# Patient Record
Sex: Female | Born: 2000 | Race: White | Hispanic: No | Marital: Single | Smoking: Never smoker
Health system: Southern US, Community
[De-identification: ages and names within clinical notes are randomized; demographics above are authoritative.]

## PROBLEM LIST (undated history)

## (undated) DIAGNOSIS — Z789 Other specified health status: Secondary | ICD-10-CM

## (undated) DIAGNOSIS — F840 Autistic disorder: Secondary | ICD-10-CM

## (undated) DIAGNOSIS — Q638 Other specified congenital malformations of kidney: Secondary | ICD-10-CM

## (undated) DIAGNOSIS — F32A Depression, unspecified: Secondary | ICD-10-CM

## (undated) DIAGNOSIS — F329 Major depressive disorder, single episode, unspecified: Secondary | ICD-10-CM

## (undated) DIAGNOSIS — F419 Anxiety disorder, unspecified: Secondary | ICD-10-CM

## (undated) HISTORY — DX: Depression, unspecified: F32.A

## (undated) HISTORY — DX: Other specified congenital malformations of kidney: Q63.8

## (undated) HISTORY — DX: Major depressive disorder, single episode, unspecified: F32.9

## (undated) HISTORY — PX: OTHER SURGICAL HISTORY: SHX169

## (undated) HISTORY — DX: Autistic disorder: F84.0

## (undated) HISTORY — DX: Anxiety disorder, unspecified: F41.9

---
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
## 2020-01-06 ENCOUNTER — Encounter: Payer: Self-pay | Admitting: *Deleted

## 2020-01-06 ENCOUNTER — Emergency Department
Admission: EM | Admit: 2020-01-06 | Discharge: 2020-01-07 | Disposition: A | Discharge status: Other Acute Inpatient Hospital | Payer: Medicaid Other | Attending: Emergency Medicine | Admitting: Emergency Medicine

## 2020-01-06 ENCOUNTER — Other Ambulatory Visit: Payer: Self-pay

## 2020-01-06 DIAGNOSIS — F332 Major depressive disorder, recurrent severe without psychotic features: Secondary | ICD-10-CM | POA: Diagnosis not present

## 2020-01-06 DIAGNOSIS — Z20822 Contact with and (suspected) exposure to covid-19: Secondary | ICD-10-CM | POA: Insufficient documentation

## 2020-01-06 DIAGNOSIS — R45851 Suicidal ideations: Secondary | ICD-10-CM | POA: Diagnosis not present

## 2020-01-06 DIAGNOSIS — F840 Autistic disorder: Secondary | ICD-10-CM | POA: Insufficient documentation

## 2020-01-06 LAB — COMPREHENSIVE METABOLIC PANEL
ALT: 10 U/L (ref 0–44)
AST: 20 U/L (ref 15–41)
Albumin: 4.9 g/dL (ref 3.5–5.0)
Alkaline Phosphatase: 77 U/L (ref 38–126)
Anion gap: 11 (ref 5–15)
BUN: 7 mg/dL (ref 6–20)
CO2: 28 mmol/L (ref 22–32)
Calcium: 9.4 mg/dL (ref 8.9–10.3)
Chloride: 104 mmol/L (ref 98–111)
Creatinine, Ser: 0.79 mg/dL (ref 0.44–1.00)
GFR, Estimated: >60 mL/min (ref >60)
Glucose, Bld: 97 mg/dL (ref 70–99)
Potassium: 3.5 mmol/L (ref 3.5–5.1)
Sodium: 143 mmol/L (ref 135–145)
Total Bilirubin: 0.9 mg/dL (ref 0.3–1.2)
Total Protein: 7.9 g/dL (ref 6.5–8.1)

## 2020-01-06 LAB — CBC
HCT: 42 % (ref 36.0–46.0)
Hemoglobin: 14.6 g/dL (ref 12.0–15.0)
MCH: 32 pg (ref 26.0–34.0)
MCHC: 34.8 g/dL (ref 30.0–36.0)
MCV: 92.1 fL (ref 80.0–100.0)
Platelets: 226 10*3/uL (ref 150–400)
RBC: 4.56 MIL/uL (ref 3.87–5.11)
RDW: 11.7 % (ref 11.5–15.5)
WBC: 5.8 10*3/uL (ref 4.0–10.5)
nRBC: 0 % (ref 0.0–0.2)

## 2020-01-06 LAB — URINE DRUG SCREEN, QUALITATIVE (ARMC ONLY)
Amphetamines, Ur Screen: NOT DETECTED
Barbiturates, Ur Screen: NOT DETECTED
Benzodiazepine, Ur Scrn: NOT DETECTED
Cannabinoid 50 Ng, Ur ~~LOC~~: NOT DETECTED
Cocaine Metabolite,Ur ~~LOC~~: NOT DETECTED
MDMA (Ecstasy)Ur Screen: NOT DETECTED
Methadone Scn, Ur: NOT DETECTED
Opiate, Ur Screen: NOT DETECTED
Phencyclidine (PCP) Ur S: NOT DETECTED
Tricyclic, Ur Screen: NOT DETECTED

## 2020-01-06 LAB — RESPIRATORY PANEL BY RT PCR (FLU A&B, COVID)
Influenza A by PCR: NEGATIVE
Influenza B by PCR: NEGATIVE
SARS Coronavirus 2 by RT PCR: NEGATIVE

## 2020-01-06 LAB — ACETAMINOPHEN LEVEL: Acetaminophen (Tylenol), Serum: <10 ug/mL — ABNORMAL LOW (ref 10–30)

## 2020-01-06 LAB — POCT PREGNANCY, URINE: Preg Test, Ur: NEGATIVE

## 2020-01-06 LAB — ETHANOL: Alcohol, Ethyl (B): <10 mg/dL (ref <10)

## 2020-01-06 LAB — SALICYLATE LEVEL: Salicylate Lvl: <7 mg/dL — ABNORMAL LOW (ref 7.0–30.0)

## 2020-01-06 NOTE — ED Notes (Signed)
Hourly rounding completed at this time, patient currently awake in hallway bed. No complaints, stable, and in no acute distress. Q15 minute rounds and monitoring via Rover and Officer to continue. 

## 2020-01-06 NOTE — ED Notes (Addendum)
Pts belongings include -  Phone  Wallet Keys Necklace Ear rings Red and black Sweatshirt Jeans Red socks Flip flops Black Shirt Brown belt Loose change in pants pocket Black underwear.
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
## 2020-01-06 NOTE — ED Notes (Signed)
Patient transferred from Triage to room [facility] after dressing out and screening for contraband. Report received from [place], [place] including situation, background, assessment and recommendations. Pt oriented to [employer] including Q15 minute rounds as well as Psychologist, counselling for their protection. Patient is alert and oriented, warm and dry in no acute distress. Patient denies HI, and AVH. Pt. Encouraged to let this nurse know if needs arise.

## 2020-01-06 NOTE — ED Notes (Signed)
Pt given graham crackers and apple juice  

## 2020-01-06 NOTE — ED Notes (Addendum)
Pts phone numbers  [facility] - [phone] Janean Sark (sister) - [phone] [address] [phone] Renea Ee [phone] Kylie - [phone]

## 2020-01-06 NOTE — BH Assessment (Signed)
PATIENT BED AVAILABLE AFTER 12:30AM  Patient is to be admitted to [facility] by Psychiatric Nurse Practitioner Nira Conn.  Attending Physician will be Dr. Les Pou.   Patient has been assigned to room 303, by [facility] Charge Nurse [place].    ER staff is aware of the admission:  [facility] ER Secretary    Dr. Scotty Court, ER MD   Thayer Ohm Patient's Nurse   [facility] Patient Access

## 2020-01-06 NOTE — ED Triage Notes (Signed)
Pt to ED reporting SI for the past two months. Pt has a hx of the same with an attempt years ago. No self harm or suicide attempts but pt reports the the thoughts have been getting worse due to "emotional pressure."   Pt has anxiety and depression medications at home that had been helping but pt reports having stopped taking them in September without any real reason. Pt reports she has them at home currently.   No HI or hallucinations. Pt denies drug or alcohol use.
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
## 2020-01-06 NOTE — ED Notes (Signed)
Pt. POC was Negative.  

## 2020-01-07 ENCOUNTER — Encounter: Payer: Self-pay | Admitting: Nurse Practitioner

## 2020-01-07 ENCOUNTER — Inpatient Hospital Stay
Admission: AD | Admit: 2020-01-07 | Discharge: 2020-01-09 | DRG: 885 | Disposition: A | Discharge status: Home / Self Care | Payer: Medicaid Other | Source: Intra-hospital | Attending: Behavioral Health | Admitting: Behavioral Health

## 2020-01-07 DIAGNOSIS — F332 Major depressive disorder, recurrent severe without psychotic features: Secondary | ICD-10-CM | POA: Diagnosis present

## 2020-01-07 DIAGNOSIS — F321 Major depressive disorder, single episode, moderate: Secondary | ICD-10-CM

## 2020-01-07 DIAGNOSIS — F419 Anxiety disorder, unspecified: Secondary | ICD-10-CM

## 2020-01-07 DIAGNOSIS — F840 Autistic disorder: Secondary | ICD-10-CM | POA: Diagnosis present

## 2020-01-07 DIAGNOSIS — Z9152 Personal history of nonsuicidal self-harm: Secondary | ICD-10-CM

## 2020-01-07 DIAGNOSIS — R45851 Suicidal ideations: Secondary | ICD-10-CM | POA: Diagnosis present

## 2020-01-07 DIAGNOSIS — F411 Generalized anxiety disorder: Secondary | ICD-10-CM | POA: Diagnosis present

## 2020-01-07 DIAGNOSIS — Z789 Other specified health status: Secondary | ICD-10-CM | POA: Diagnosis present

## 2020-01-07 DIAGNOSIS — F649 Gender identity disorder, unspecified: Secondary | ICD-10-CM | POA: Diagnosis present

## 2020-01-07 DIAGNOSIS — Z818 Family history of other mental and behavioral disorders: Secondary | ICD-10-CM

## 2020-01-07 LAB — LIPID PANEL
Cholesterol: 178 mg/dL (ref 0–200)
HDL: 63 mg/dL (ref >40)
LDL Cholesterol: 99 mg/dL (ref 0–99)
Total CHOL/HDL Ratio: 2.8 RATIO
Triglycerides: 78 mg/dL (ref <150)
VLDL: 16 mg/dL (ref 0–40)

## 2020-01-07 LAB — TSH: TSH: 2.89 u[IU]/mL (ref 0.350–4.500)

## 2020-01-07 MED ORDER — MAGNESIUM HYDROXIDE 400 MG/5ML PO SUSP: 30.0000 mL | Freq: Every day | ORAL | Status: DC | PRN

## 2020-01-07 MED ORDER — TRAZODONE HCL 50 MG PO TABS: 50.0000 mg | ORAL_TABLET | Freq: Every evening | ORAL | Status: DC | PRN

## 2020-01-07 MED ORDER — ESCITALOPRAM OXALATE 10 MG PO TABS
10.0000 mg | ORAL_TABLET | Freq: Every day | ORAL | Status: DC
  Administered 2020-01-07 – 2020-01-09 (×3): 10 mg via ORAL
  Filled 2020-01-07 (×3): qty 1

## 2020-01-07 MED ORDER — ALUM & MAG HYDROXIDE-SIMETH 200-200-20 MG/5ML PO SUSP: 30.0000 mL | ORAL | Status: DC | PRN

## 2020-01-07 MED ORDER — ACETAMINOPHEN 325 MG PO TABS: 650.0000 mg | ORAL_TABLET | Freq: Four times a day (QID) | ORAL | Status: DC | PRN

## 2020-01-07 MED ORDER — HYDROXYZINE HCL 25 MG PO TABS: 25.0000 mg | ORAL_TABLET | Freq: Three times a day (TID) | ORAL | Status: DC | PRN

## 2020-01-07 NOTE — Progress Notes (Signed)
D- Patient alert and oriented. Affect/mood is calm and  cooperative. Pt denies SI, HI, AVH, and pain. Pt stated that he got much needed sleep today, he went to groups and denies SI.   A- Scheduled medications administered to patient, per MD orders. Support and encouragement provided.  Routine safety checks conducted every 15 minutes.  Patient informed to notify staff with problems or concerns.  R- No adverse drug reactions noted. Patient contracts for safety at this time. Patient compliant with medications and treatment plan. Patient receptive, calm, and cooperative. Patient interacts well with others on the unit.  Patient remains safe at this time.  Torrie Mayers RN

## 2020-01-07 NOTE — Progress Notes (Signed)
Patient admitted from [facility] ED, report received from [place], [place]. Patient searched and no contraband found. Skin check completed with Liborio Nixon, no abnormalities found. Patient denies SI but stated he was feeling it prior to coming in. Patient stated that he has stopped taking depression medication and is wanting to get back on them. Patient calm and pleasant. Patient oriented to the unit and his room. Patient given education, support, and encouragement to be active in his treatment plan. Patient being monitored Q 15 minutes for safety per unit protocol. Patient remains safe on the unit.

## 2020-01-07 NOTE — BHH Counselor (Signed)
LCSW Group Therapy Note  01/07/2020 2:10 PM  Type of Therapy/Topic:  Group Therapy:  Feelings about Diagnosis  Participation Level:  Minimal   Description of Group:   This group will allow patients to explore their thoughts and feelings about diagnoses they have received. Patients will be guided to explore their level of understanding and acceptance of these diagnoses. Facilitator will encourage patients to process their thoughts and feelings about the reactions of others to their diagnosis and will guide patients in identifying ways to discuss their diagnosis with significant others in their lives. This group will be process-oriented, with patients participating in exploration of their own experiences, giving and receiving support, and processing challenge from other group members.   Therapeutic Goals: 1. Patient will demonstrate understanding of diagnosis as evidenced by identifying two or more symptoms of the disorder 2. Patient will be able to express two feelings regarding the diagnosis 3. Patient will demonstrate their ability to communicate their needs through discussion and/or role play  Summary of Patient Progress: Patient engaged in the conversation minimally and was on topic.    Therapeutic Modalities:   Cognitive Behavioral Therapy Brief Therapy Feelings Identification   Christerpher Clos R. Algis Greenhouse, MSW, LCSW, LCAS 01/07/2020 2:10 PM
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
## 2020-01-07 NOTE — Plan of Care (Signed)
Patient new to the unit tonight, hasn't had time to progress   Problem: Education: Goal: Knowledge of [facility] Education information/materials will improve Outcome: Not Progressing Goal: Emotional status will improve Outcome: Not Progressing Goal: Mental status will improve Outcome: Not Progressing Goal: Verbalization of understanding the information provided will improve Outcome: Not Progressing   Problem: Safety: Goal: Periods of time without injury will increase Outcome: Not Progressing   Problem: Education: Goal: Ability to make informed decisions regarding treatment will improve Outcome: Not Progressing   Problem: Self-Concept: Goal: Ability to disclose and discuss suicidal ideas will improve Outcome: Not Progressing Goal: Will verbalize positive feelings about self Outcome: Not Progressing   
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
## 2020-01-07 NOTE — Plan of Care (Addendum)
Pt rates depression 4/10 and anxiety 5/10. Pt denies HI and AVH. Pt has passive SI with no plan. Pt verbalizes for safety. Pt was educated on care plan and verbalizes understanding. Pt was encouraged to atend groups. Torrie Mayers RN Problem: Education: Goal: Charity fundraiser Education information/materials will improve Outcome: Progressing Goal: Emotional status will improve Outcome: Progressing Goal: Mental status will improve Outcome: Not Progressing Goal: Verbalization of understanding the information provided will improve Outcome: Progressing   Problem: Safety: Goal: Periods of time without injury will increase Outcome: Progressing   Problem: Education: Goal: Ability to make informed decisions regarding treatment will improve Outcome: Progressing   Problem: Self-Concept: Goal: Ability to disclose and discuss suicidal ideas will improve Outcome: Progressing Goal: Will verbalize positive feelings about self Outcome: Progressing

## 2020-01-07 NOTE — Progress Notes (Signed)
Recreation Therapy Notes   Date: 01/07/2020  Time: 9:30 am   Location: Craft room   Behavioral response: Appropriate  Intervention Topic: Coping Skills   Discussion/Intervention:  Group content on today was focused on coping skills. The group defined what coping skills are and when they normally use coping skills. Individuals described how they normally cope with thing and the coping skills they normally use. Patients expressed why it is important to cope with things and how not coping with things can affect you. The group participated in the intervention "My coping box" and made coping boxes while adding coping skills they could use in the future to the box. Clinical Observations/Feedback: Patient came to group and identified music as a coping skill used. Individual was pulled from group by social work for an assessment and did not return.  Xaiver Roskelley LRT/CTRS         Mercia Dowe 01/07/2020 12:25 PM

## 2020-01-07 NOTE — ED Notes (Signed)
Hourly rounding completed at this time, patient currently awake in hallway bed. No complaints, stable, and in no acute distress. Q15 minute rounds and monitoring via Rover and Officer to continue. 
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
## 2020-01-07 NOTE — [facility] Suicide Risk Assessment (Signed)
[facility] INPATIENT:  Family/Significant Other Suicide Prevention Education  Suicide Prevention Education:  Education Completed; Shaylee Gomez/friend, [phone] has been identified by the patient as the family member/significant other with whom the patient will be residing, and identified as the person(s) who will aid the patient in the event of a mental health crisis (suicidal ideations/suicide attempt).  With written consent from the patient, the family member/significant other has been provided the following suicide prevention education, prior to the and/or following the discharge of the patient.  The suicide prevention education provided includes the following:  Suicide risk factors  Suicide prevention and interventions  National Suicide Hotline telephone number  [facility] assessment telephone number  [facility] Emergency Assistance 911  [facility] and/or Residential Mobile Crisis Unit telephone number  Request made of family/significant other to:  Remove weapons (e.g., guns, rifles, knives), all items previously/currently identified as safety concern.    Remove drugs/medications (over-the-counter, prescriptions, illicit drugs), all items previously/currently identified as a safety concern.  The family member/significant other verbalizes understanding of the suicide prevention education information provided.  The family member/significant other agrees to remove the items of safety concern listed above.  Lily Kocher states that patient called her upset, yelling and in tears. She shares that he endorsed worsening thoughts of suicide with plans to act on them. She states that she had another friend to call the cops for patient's safety. She stated that patient informed her that his father has a Location manager. Lily Kocher states that she believes he would have hurt himself last night if she had not called the police due to feelings of loneliness, depression, and anxiety. CSW to staff  case with supervisor regarding further safety planning as consent was not given to speak to patient's father, where patient plans to return and revolver was endorsed to be housed.   Glenis Smoker 01/07/2020, 12:10 PM

## 2020-01-07 NOTE — Tx Team (Signed)
Initial Treatment Plan 01/07/2020 1:15 AM [employer] [id number]    PATIENT STRESSORS: Marital or family conflict Medication change or noncompliance   PATIENT STRENGTHS: Motivation for treatment/growth Supportive family/friends   PATIENT IDENTIFIED PROBLEMS: Suicidal Ideation  Depression  Anxiety                 DISCHARGE CRITERIA:  Improved stabilization in mood, thinking, and/or behavior Motivation to continue treatment in a less acute level of care  PRELIMINARY DISCHARGE PLAN: Outpatient therapy Return to previous living arrangement  PATIENT/FAMILY INVOLVEMENT: This treatment plan has been presented to and reviewed with the patient, [facility]. The patient has been given the opportunity to ask questions and make suggestions.  Elmyra Ricks, RN 01/07/2020, 1:15 AM

## 2020-01-08 LAB — HEMOGLOBIN A1C
Hgb A1c MFr Bld: 5.1 % (ref 4.8–5.6)
Mean Plasma Glucose: 100 mg/dL

## 2020-01-08 NOTE — Tx Team (Signed)
Interdisciplinary Treatment and Diagnostic Plan Update  01/08/2020 Time of Session: 09:00 ZYLIAH SCHIER MRN: [id number]  Principal Diagnosis: Severe recurrent major depression without psychotic features (HCC)  Secondary Diagnoses: Principal Problem:   Severe recurrent major depression without psychotic features (HCC) Active Problems:   Transgender   Generalized anxiety disorder   Current Medications:  Current Facility-Administered Medications  Medication Dose Route Frequency Provider Last Rate Last Admin   acetaminophen (TYLENOL) tablet 650 mg  650 mg Oral Q6H PRN Jackelyn Poling, NP       alum & mag hydroxide-simeth (MAALOX/MYLANTA) 200-200-20 MG/5ML suspension 30 mL  30 mL Oral Q4H PRN Jackelyn Poling, NP       escitalopram (LEXAPRO) tablet 10 mg  10 mg Oral Daily Jesse Sans, MD   10 mg at 01/08/20 0818   hydrOXYzine (ATARAX/VISTARIL) tablet 25 mg  25 mg Oral TID PRN Jackelyn Poling, NP       magnesium hydroxide (MILK OF MAGNESIA) suspension 30 mL  30 mL Oral Daily PRN Jackelyn Poling, NP       traZODone (DESYREL) tablet 50 mg  50 mg Oral QHS PRN Jackelyn Poling, NP       PTA Medications: Medications Prior to Admission  Medication Sig Dispense Refill Last Dose   escitalopram (LEXAPRO) 10 MG tablet Take 1 tablet (10 mg total) by mouth daily. 30 tablet 5     Patient Stressors: Marital or family conflict Medication change or noncompliance  Patient Strengths: Motivation for treatment/growth Supportive family/friends  Treatment Modalities: Medication Management, Group therapy, Case management,  1 to 1 session with clinician, Psychoeducation, Recreational therapy.   Physician Treatment Plan for Primary Diagnosis: Severe recurrent major depression without psychotic features (HCC) Long Term Goal(s): Improvement in symptoms so as ready for discharge Improvement in symptoms so as ready for discharge   Short Term Goals: Ability to identify changes in lifestyle to reduce  recurrence of condition will improve Ability to verbalize feelings will improve Ability to disclose and discuss suicidal ideas Ability to demonstrate self-control will improve Ability to identify and develop effective coping behaviors will improve Compliance with prescribed medications will improve Ability to identify changes in lifestyle to reduce recurrence of condition will improve Ability to verbalize feelings will improve Ability to disclose and discuss suicidal ideas Ability to demonstrate self-control will improve Ability to identify and develop effective coping behaviors will improve Compliance with prescribed medications will improve  Medication Management: Evaluate patient's response, side effects, and tolerance of medication regimen.  Therapeutic Interventions: 1 to 1 sessions, Unit Group sessions and Medication administration.  Evaluation of Outcomes: Progressing  Physician Treatment Plan for Secondary Diagnosis: Principal Problem:   Severe recurrent major depression without psychotic features (HCC) Active Problems:   Transgender   Generalized anxiety disorder  Long Term Goal(s): Improvement in symptoms so as ready for discharge Improvement in symptoms so as ready for discharge   Short Term Goals: Ability to identify changes in lifestyle to reduce recurrence of condition will improve Ability to verbalize feelings will improve Ability to disclose and discuss suicidal ideas Ability to demonstrate self-control will improve Ability to identify and develop effective coping behaviors will improve Compliance with prescribed medications will improve Ability to identify changes in lifestyle to reduce recurrence of condition will improve Ability to verbalize feelings will improve Ability to disclose and discuss suicidal ideas Ability to demonstrate self-control will improve Ability to identify and develop effective coping behaviors will improve Compliance with prescribed  medications will  improve     Medication Management: Evaluate patient's response, side effects, and tolerance of medication regimen.  Therapeutic Interventions: 1 to 1 sessions, Unit Group sessions and Medication administration.  Evaluation of Outcomes: Progressing   RN Treatment Plan for Primary Diagnosis: Severe recurrent major depression without psychotic features (HCC) Long Term Goal(s): Knowledge of disease and therapeutic regimen to maintain health will improve  Short Term Goals: Ability to remain free from injury will improve, Ability to verbalize frustration and anger appropriately will improve, Ability to demonstrate self-control, Ability to participate in decision making will improve, Ability to verbalize feelings will improve, Ability to disclose and discuss suicidal ideas, Ability to identify and develop effective coping behaviors will improve and Compliance with prescribed medications will improve  Medication Management: RN will administer medications as ordered by provider, will assess and evaluate patient's response and provide education to patient for prescribed medication. RN will report any adverse and/or side effects to prescribing provider.  Therapeutic Interventions: 1 on 1 counseling sessions, Psychoeducation, Medication administration, Evaluate responses to treatment, Monitor vital signs and CBGs as ordered, Perform/monitor CIWA, COWS, AIMS and Fall Risk screenings as ordered, Perform wound care treatments as ordered.  Evaluation of Outcomes: Progressing   LCSW Treatment Plan for Primary Diagnosis: Severe recurrent major depression without psychotic features (HCC) Long Term Goal(s): Safe transition to appropriate next level of care at discharge, Engage patient in therapeutic group addressing interpersonal concerns.  Short Term Goals: Engage patient in aftercare planning with referrals and resources, Increase social support, Increase ability to appropriately verbalize  feelings, Increase emotional regulation, Facilitate acceptance of mental health diagnosis and concerns, Identify triggers associated with mental health/substance abuse issues and Increase skills for wellness and recovery  Therapeutic Interventions: Assess for all discharge needs, 1 to 1 time with Social worker, Explore available resources and support systems, Assess for adequacy in community support network, Educate family and significant other(s) on suicide prevention, Complete Psychosocial Assessment, Interpersonal group therapy.  Evaluation of Outcomes: Progressing   Progress in Treatment: Attending groups: Yes. Participating in groups: Yes. Taking medication as prescribed: Yes. Toleration medication: Yes. Family/Significant other contact made: Yes, individual(s) contacted:  SPE completed with friend Patient understands diagnosis: Yes. Discussing patient identified problems/goals with staff: Yes. Medical problems stabilized or resolved: Yes. Denies suicidal/homicidal ideation: Yes. Issues/concerns per patient self-inventory: No. Other: None  New problem(s) identified: No, Describe:  none.  New Short Term/Long Term Goal(s): medication management for mood stabilization; elimination of SI thoughts; development of comprehensive mental wellness plan.  Patient Goals: "Not really sure."   Discharge Plan or Barriers: Patient plans to return home to father's house and has follow up with [facility] scheduled.  Reason for Continuation of Hospitalization: Anxiety Depression Medication stabilization Suicidal ideation  Estimated Length of Stay: 1-7 days  Attendees: Patient: Deneene Tarver" Steveson 01/08/2020 10:22 AM  Physician: Les Pou, MD 01/08/2020 10:22 AM  Nursing: Torrie Mayers, RN 01/08/2020 10:22 AM  RN Care Manager: 01/08/2020 10:22 AM  Social Worker: Penni Homans, MSW, LCSW 01/08/2020 10:22 AM  Recreational Therapist: Garret Reddish, Drue Flirt, LRT 01/08/2020 10:22 AM  Other: Vilma Meckel. Algis Greenhouse, MSW, LCSW, LCAS 01/08/2020 10:22 AM  Other:  01/08/2020 10:22 AM  Other: 01/08/2020 10:22 AM    Scribe for Treatment Team: Glenis Smoker, LCSW 01/08/2020 10:22 AM

## 2020-01-08 NOTE — [facility] Group Notes (Signed)
LCSW Group Therapy Note  01/08/2020 2:07 PM  Type of Therapy/Topic:  Group Therapy:  Emotion Regulation  Participation Level:  Active   Description of Group:   The purpose of this group is to assist patients in learning to regulate negative emotions and experience positive emotions. Patients will be guided to discuss ways in which they have been vulnerable to their negative emotions. These vulnerabilities will be juxtaposed with experiences of positive emotions or situations, and patients will be challenged to use positive emotions to combat negative ones. Special emphasis will be placed on coping with negative emotions in conflict situations, and patients will process healthy conflict resolution skills.  Therapeutic Goals: 1. Patient will identify two positive emotions or experiences to reflect on in order to balance out negative emotions 2. Patient will label two or more emotions that they find the most difficult to experience 3. Patient will demonstrate positive conflict resolution skills through discussion and/or role plays  Summary of Patient Progress: Patient was present in group. Patient was an active participant.  Patient shared how he is triggered by his father.  He shared that he listens to music as a coping skills.  He was attentive and supportive in group.    Therapeutic Modalities:   Cognitive Behavioral Therapy Feelings Identification Dialectical Behavioral Therapy  Penni Homans, MSW, LCSW 01/08/2020 2:07 PM

## 2020-01-08 NOTE — Progress Notes (Signed)
Contacted father, Luwana Butrick, at [phone]: He states he does own a revolver, but keeps it locked in a safe. It also has a trigger lock in place. He feels comfortable with Regina Atkins returning home tomorrow, and following up with outpatient provider. He had no questions or concerns at this time.

## 2020-01-08 NOTE — Progress Notes (Signed)
Patient slept through the night without incident. Was safe on the unit with 15 minute safety checks.      Regina Butler-Nicholson, LPN 

## 2020-01-08 NOTE — Plan of Care (Signed)
Pt rates depression 7/10 and denies anxiety, SI, HI and AVH. Pt was educated on care plan and verbalizes understanding. Torrie Mayers RN Problem: Education: Goal: Knowledge of [facility] Education information/materials will improve Outcome: Adequate for Discharge Goal: Emotional status will improve Outcome: Adequate for Discharge Goal: Mental status will improve Outcome: Adequate for Discharge Goal: Verbalization of understanding the information provided will improve Outcome: Adequate for Discharge   Problem: Safety: Goal: Periods of time without injury will increase Outcome: Adequate for Discharge   Problem: Education: Goal: Ability to make informed decisions regarding treatment will improve Outcome: Adequate for Discharge   Problem: Self-Concept: Goal: Ability to disclose and discuss suicidal ideas will improve Outcome: Adequate for Discharge Goal: Will verbalize positive feelings about self Outcome: Adequate for Discharge

## 2020-01-08 NOTE — Progress Notes (Signed)
Recreation Therapy Notes  Date: 01/08/2020  Time: 9:30 am   Location: Craft room   Behavioral response: Appropriate  Intervention Topic: Teamwork   Discussion/Intervention:  Group content on today was focused on teamwork. The group identified what teamwork is. Individuals described who is a part of their team. Patients expressed why they thought teamwork is important. The group stated reasons why they thought it was easier to work with a Comptroller team. Individuals discussed some positives and negatives of working with a team. Patients gave examples of past experiences they had while working with a team. The group participated in the intervention "What is That", where patients were given a chance to point out qualities, they look for in a teammate and were able to work in teams with each other. Clinical Observations/Feedback: Patient came to group and expressed that teamwork reduces stress and gives you a support system. She explained that his friends are a big part of his team. Participant identified integrity and determination as characteristics he looks for in a team member. Individual was social with peers and staff while participating in the intervention.  Regina Atkins LRT/CTRS         Lasean Rahming 01/08/2020 12:20 PM

## 2020-01-08 NOTE — Progress Notes (Signed)
[facility]  01/08/2020 12:25 PM Regina Atkins  MRN:  [id number]   Subjective:  Regina Atkins seen in treatment team, and one-on-one this afternoon. He states he is feeling much better than yesterday. He tolerated restarting Lexapro at 10 mg. No headaches, nausea, or diarrhea at this time. He no longer feels suicidal at this time. Denies visual hallucinations, auditory hallucinations, or homicidal ideations. He has a productive conversation with the representative for [facility], and plans to follow up there for psychiatric care upon leaving the hospital. Regina Atkins provided permission to contact his father. Attempted to call Thereasa DistanceRodney at [phone], but no answer and voicemail was full.   Principal Problem: Severe recurrent major depression without psychotic features (HCC) Diagnosis: Principal Problem:   Severe recurrent major depression without psychotic features (HCC) Active Problems:   Transgender   Generalized anxiety disorder  Total Time spent with patient: 45 minutes  Past Psychiatric History: Previously on Lexapro which he found helpful for anxiety and depression. No previous hospital stays or suicide attempts. History of self-harm via biting  Past Medical History:  Past Medical History:  Diagnosis Date  . Anxiety   . Autism    mild case   . Depression   . Duplex kidney     Past Surgical History:  Procedure Laterality Date  . kidney wiring     Family History: History reviewed. No pertinent family history. Family Psychiatric  History: Youngest sister with narcolepsy, sister age 19 with depression and recent suicide attempt via overdose on medication. Mother with major depressive disorder, anxiety, and alcohol use disorder. Father with major depressive disorder, anxiety, and cannabis use disorder Social History:  Social History   Substance and Sexual Activity  Alcohol Use Never     Social History   Substance and Sexual Activity  Drug Use Never    Social History    Socioeconomic History  . Marital status: Single    Spouse name: Not on file  . Number of children: Not on file  . Years of education: Not on file  . Highest education level: Not on file  Occupational History  . Not on file  Tobacco Use  . Smoking status: Never Smoker  . Smokeless tobacco: Never Used  Vaping Use  . Vaping Use: Never used  Substance and Sexual Activity  . Alcohol use: Never  . Drug use: Never  . Sexual activity: Never  Other Topics Concern  . Not on file  Social History Narrative  . Not on file   Social Determinants of Health   Financial Resource Strain:   . Difficulty of Paying Living Expenses: Not on file  Food Insecurity:   . Worried About Programme researcher, broadcasting/film/videounning Out of Food in the Last Year: Not on file  . Ran Out of Food in the Last Year: Not on file  Transportation Needs:   . Lack of Transportation (Medical): Not on file  . Lack of Transportation (Non-Medical): Not on file  Physical Activity:   . Days of Exercise per Week: Not on file  . Minutes of Exercise per Session: Not on file  Stress:   . Feeling of Stress : Not on file  Social Connections:   . Frequency of Communication with Friends and Family: Not on file  . Frequency of Social Gatherings with Friends and Family: Not on file  . Attends Religious Services: Not on file  . Active Member of Clubs or Organizations: Not on file  . Attends BankerClub or Organization Meetings: Not on file  .  Marital Status: Not on file   Additional Social History:      Sleep: Fair  Appetite:  Fair  Current Medications: Current Facility-Administered Medications  Medication Dose Route Frequency Provider Last Rate Last Admin  . acetaminophen (TYLENOL) tablet 650 mg  650 mg Oral Q6H PRN Jackelyn Poling, NP      . alum & mag hydroxide-simeth (MAALOX/MYLANTA) 200-200-20 MG/5ML suspension 30 mL  30 mL Oral Q4H PRN Jackelyn Poling, NP      . escitalopram (LEXAPRO) tablet 10 mg  10 mg Oral Daily Jesse Sans, MD   10 mg at  01/08/20 0818  . hydrOXYzine (ATARAX/VISTARIL) tablet 25 mg  25 mg Oral TID PRN Jackelyn Poling, NP      . magnesium hydroxide (MILK OF MAGNESIA) suspension 30 mL  30 mL Oral Daily PRN Jackelyn Poling, NP      . traZODone (DESYREL) tablet 50 mg  50 mg Oral QHS PRN Jackelyn Poling, NP        Lab Results:  Results for orders placed or performed during the hospital encounter of 01/07/20 (from the past 48 hour(s))  Hemoglobin A1c     Status: None   Collection Time: 01/07/20  7:51 AM  Result Value Ref Range   Hgb A1c MFr Bld 5.1 4.8 - 5.6 %    Comment: (NOTE)         Prediabetes: 5.7 - 6.4         Diabetes: >6.4         Glycemic control for adults with diabetes: <7.0    Mean Plasma Glucose 100 mg/dL    Comment: (NOTE) Performed At: [facility] [address] [place], [place] [id number] Jolene Schimke MD [id number]   Lipid panel     Status: None   Collection Time: 01/07/20  7:51 AM  Result Value Ref Range   Cholesterol 178 0 - 200 mg/dL   Triglycerides 78 <660 mg/dL   HDL 63 >63 mg/dL   Total CHOL/HDL Ratio 2.8 RATIO   VLDL 16 0 - 40 mg/dL   LDL Cholesterol 99 0 - 99 mg/dL    Comment:        Total Cholesterol/HDL:CHD Risk Coronary Heart Disease Risk Table                     Men   Women  1/2 Average Risk   3.4   3.3  Average Risk       5.0   4.4  2 X Average Risk   9.6   7.1  3 X Average Risk  23.4   11.0        Use the calculated Patient Ratio above and the CHD Risk Table to determine the patient's CHD Risk.        ATP III CLASSIFICATION (LDL):  <100     mg/dL   Optimal  016-010  mg/dL   Near or Above                    Optimal  130-159  mg/dL   Borderline  932-355  mg/dL   High  >732     mg/dL   Very High Performed at [facility], [address] [place], [place] [address]   TSH     Status: None   Collection Time: 01/07/20  7:51 AM  Result Value Ref Range   TSH 2.890 0.350 - 4.500 uIU/mL    Comment: Performed  by a 3rd Generation assay with a  functional sensitivity of <=0.01 uIU/mL. Performed at [facility], [address] [place], [place] [address]     Blood Alcohol level:  Lab Results  Component Value Date   [facility] <10 01/06/2020    Metabolic Disorder Labs: Lab Results  Component Value Date   HGBA1C 5.1 01/07/2020   MPG 100 01/07/2020   No results found for: PROLACTIN Lab Results  Component Value Date   CHOL 178 01/07/2020   TRIG 78 01/07/2020   HDL 63 01/07/2020   CHOLHDL 2.8 01/07/2020   VLDL 16 01/07/2020   LDLCALC 99 01/07/2020    Physical Findings: AIMS:  , ,  ,  ,    CIWA:    COWS:     Musculoskeletal: Strength & Muscle Tone: within normal limits Gait & Station: normal Patient leans: N/A  Psychiatric Specialty Exam: Physical Exam Vitals and nursing note reviewed.  Constitutional:      Appearance: Normal appearance.  HENT:     Head: Normocephalic and atraumatic.     Right Ear: External ear normal.     Left Ear: External ear normal.     Nose: Nose normal.     Mouth/Throat:     Mouth: Mucous membranes are moist.     Pharynx: Oropharynx is clear.  Eyes:     Extraocular Movements: Extraocular movements intact.     Conjunctiva/sclera: Conjunctivae normal.     Pupils: Pupils are equal, round, and reactive to light.  Cardiovascular:     Rate and Rhythm: Normal rate.     Pulses: Normal pulses.  Pulmonary:     Effort: Pulmonary effort is normal.     Breath sounds: Normal breath sounds.  Abdominal:     General: Abdomen is flat.     Palpations: Abdomen is soft.  Musculoskeletal:        General: No swelling. Normal range of motion.     Cervical back: Normal range of motion and neck supple.  Skin:    General: Skin is warm and dry.  Neurological:     General: No focal deficit present.     Mental Status: He is alert and oriented to person, place, and time.  Psychiatric:        Behavior: Behavior normal.        Judgment: Judgment normal.     Review of Systems  Constitutional:  Negative for activity change and fatigue.  HENT: Negative for rhinorrhea and sore throat.   Eyes: Negative for photophobia and visual disturbance.  Respiratory: Negative for cough and shortness of breath.   Cardiovascular: Negative for chest pain and palpitations.  Gastrointestinal: Negative for constipation, diarrhea, nausea and vomiting.  Endocrine: Negative for cold intolerance and polydipsia.  Genitourinary: Negative for difficulty urinating and dysuria.  Musculoskeletal: Negative for arthralgias and myalgias.  Skin: Negative for rash and wound.  Allergic/Immunologic: Negative for environmental allergies and food allergies.  Neurological: Negative for dizziness and headaches.  Hematological: Negative for adenopathy. Does not bruise/bleed easily.  Psychiatric/Behavioral: Positive for dysphoric mood. Negative for hallucinations, sleep disturbance and suicidal ideas. The patient is nervous/anxious.     Blood pressure 113/85, pulse 90, temperature 97.7 F (36.5 C), temperature source Oral, resp. rate 17, height 5\' 5"  (1.651 m), weight 60.8 kg, SpO2 100 %.Body mass index is 22.3 kg/m.  General Appearance: Well Groomed  Eye Contact:  Good  Speech:  Normal Rate  Volume:  Normal  Mood:  Euthymic  Affect:  Congruent  Thought Process:  Coherent and Linear  Orientation:  Full (Time, Place, and Person)  Thought Content:  Logical  Suicidal Thoughts:  No  Homicidal Thoughts:  No  Memory:  Immediate;   Fair Recent;   Fair Remote;   Fair  Judgement:  Good  Insight:  Good  Psychomotor Activity:  Normal  Concentration:  Concentration: Fair and Attention Span: Fair  Recall:  [employer] of Knowledge:  Fair  Language:  Fair  Akathisia:  Negative  Handed:  Right  AIMS (if indicated):     Assets:  Communication Skills Desire for Improvement Financial Resources/Insurance Housing Leisure Time Physical Health Resilience Social Support Talents/Skills Transportation Vocational/Educational   ADL's:  Intact  Cognition:  WNL  Sleep:  Number of Hours: 7.75     Treatment Plan Summary: Daily contact with patient to assess and evaluate symptoms and progress in treatment, Medication management and Plan continue medications as above. Disposition pending converastion with father, and securing of revolver at home.  Jesse Sans, MD 01/08/2020, 12:25 PM

## 2020-01-08 NOTE — Progress Notes (Signed)
D- Patient alert and oriented. Pt affect/mood is pleasant. Pt denies SI, HI, AVH, and pain.   A- Scheduled medications administered to patient, per MD orders. Support and encouragement provided.  Routine safety checks conducted every 15 minutes.  Patient informed to notify staff with problems or concerns.  R- No adverse drug reactions noted. Patient contracts for safety at this time. Patient compliant with medications and treatment plan. Patient receptive, calm, and cooperative. Patient interacts well with others on the unit.  Patient remains safe at this time.  Torrie Mayers RN

## 2020-01-09 MED ORDER — ESCITALOPRAM OXALATE 10 MG PO TABS: 10.0000 mg | ORAL_TABLET | Freq: Every day | ORAL | 1 refills | Status: AC

## 2020-01-09 MED ORDER — ESCITALOPRAM OXALATE 10 MG PO TABS: 10.0000 mg | ORAL_TABLET | Freq: Every day | ORAL | 1 refills | Status: DC

## 2020-01-09 NOTE — Progress Notes (Signed)
  [facility] Adult Case Management Discharge Plan :  Will you be returning to the same living situation after discharge:  Yes,  pt reports plans to return home to father's house. At discharge, do you have transportation home?: Yes,  pt reports a friend will be providing transportation home.  Do you have the ability to pay for your medications: Yes,  [place] Medicaid Prepaid Health Plan/[place] [employer] Healthy Blue  Release of information consent forms completed and in the chart;  Patient's signature needed at discharge.  Patient to Follow up at:  Follow-up Information    [facility] Follow up on 01/14/2020.   Why: Your appointment is scheduled for 01/15/20 at 2:00pm. This will be a virtual appointment (via Zoom) with Hardie Shackleton. Contact information: [address] [place] [address] [phone]               Next level of care provider has access to [facility] Link:no  Safety Planning and Suicide Prevention discussed: Yes,  SPE completed with friend, Heywood Bene  Have you used any form of tobacco in the last 30 days? (Cigarettes, Smokeless Tobacco, Cigars, and/or Pipes): No  Has patient been referred to the Quitline?: Patient refused referral  Patient has been referred for addiction treatment: Pt. refused referral  Glenis Smoker, LCSW 01/09/2020, 9:22 AM

## 2020-01-09 NOTE — Progress Notes (Signed)
Patient denies SI/HI, denies A/V hallucinations. Patient verbalizes understanding of discharge instructions, follow up care and prescriptions. Patient given all belongings from [facility] locker. Patient escorted out by staff, transported by family. 

## 2020-01-09 NOTE — Plan of Care (Signed)
  Problem: Safety: Goal: Periods of time without injury will increase Outcome: Progressing   Problem: Education: Goal: Knowledge of [place] [facility] Education information/materials will improve Outcome: Progressing Goal: Emotional status will improve Outcome: Progressing Goal: Mental status will improve Outcome: Progressing Goal: Verbalization of understanding the information provided will improve Outcome: Progressing   

## 2020-01-09 NOTE — [facility] Suicide Risk Assessment (Signed)
[facility] Discharge Suicide Risk Assessment   Principal Problem: Severe recurrent major depression without psychotic features [facility]) Discharge Diagnoses: Principal Problem:   Severe recurrent major depression without psychotic features (HCC) Active Problems:   Transgender   Generalized anxiety disorder   Total Time spent with patient: 30 minutes  Musculoskeletal: Strength & Muscle Tone: within normal limits Gait & Station: normal Patient leans: N/A  Psychiatric Specialty Exam: Review of Systems  Constitutional: Negative for activity change and appetite change.  HENT: Negative for rhinorrhea and sore throat.   Eyes: Negative for photophobia and visual disturbance.  Respiratory: Negative for cough and shortness of breath.   Cardiovascular: Negative for chest pain and palpitations.  Gastrointestinal: Negative for constipation, diarrhea, nausea and vomiting.  Endocrine: Negative for cold intolerance and heat intolerance.  Genitourinary: Negative for difficulty urinating and dysuria.  Musculoskeletal: Negative for back pain and myalgias.  Skin: Negative for rash and wound.  Allergic/Immunologic: Negative for environmental allergies and food allergies.  Neurological: Negative for dizziness and headaches.  Hematological: Negative for adenopathy. Does not bruise/bleed easily.  Psychiatric/Behavioral: Negative for dysphoric mood, hallucinations and suicidal ideas.    Blood pressure 107/81, pulse 78, temperature 97.9 F (36.6 C), temperature source Oral, resp. rate 17, height 5\' 5"  (1.651 m), weight 60.8 kg, SpO2 100 %.Body mass index is 22.3 kg/m.  General Appearance: Well Groomed  ::  Good  Speech:  Normal Rate409  Volume:  Normal  Mood:  Euthymic  Affect:  Congruent  Thought Process:  Coherent  Orientation:  Full (Time, Place, and Person)  Thought Content:  Logical  Suicidal Thoughts:  No  Homicidal Thoughts:  No  Memory:  Immediate;   Fair Recent;   Fair Remote;    Fair  Judgement:  Fair  Insight:  Fair  Psychomotor Activity:  Normal  Concentration:  Good  Recall:  Fair  Fund of Knowledge:Good  Language: Good  Akathisia:  Negative  Handed:  Right  AIMS (if indicated):     Assets:  Communication Skills Desire for Improvement Financial Resources/Insurance Housing Leisure Time Physical Health Resilience Social Support Talents/Skills Transportation Vocational/Educational  Sleep:  Number of Hours: 7.75  Cognition: WNL  ADL's:  Intact   Mental Status Per Nursing Assessment::   On Admission:  NA  Demographic Factors:  Adolescent or young adult and Caucasian  Loss Factors: NA  Historical Factors: Family history of mental illness or substance abuse and Impulsivity  Risk Reduction Factors:   Sense of responsibility to family, Employed, Living with another person, especially a relative, Positive social support, Positive therapeutic relationship and Positive coping skills or problem solving skills  Continued Clinical Symptoms:  Depression:   Impulsivity Insomnia  Cognitive Features That Contribute To Risk:  None    Suicide Risk:  Minimal: No identifiable suicidal ideation.  Patients presenting with no risk factors but with morbid ruminations; may be classified as minimal risk based on the severity of the depressive symptoms   Follow-up Information    [facility] Follow up on 01/14/2020.   Why: Your appointment is scheduled for 01/15/20 at 2:00pm. This will be a virtual appointment (via Zoom) with 01/17/20. Contact information: [address] [place] [phone]               Plan Of Care/Follow-up recommendations:  Activity:  as tolerated Diet:  regular diet  [phone], MD 01/09/2020, 9:44 AM

## 2020-01-09 NOTE — Discharge Summary (Signed)
Physician Discharge Summary Note  Patient:  Regina Atkins is an 19 y.o., adult MRN:  [id number] DOB:  October 07, 2000 Patient phone:  [phone] (home)  Patient address:   [address] [place] [address],  Total Time spent with patient: 30 minutes  Date of Admission:  01/07/2020 Date of Discharge: 01/09/2020  Reason for Admission:  Worsening depression with suicidal ideations with plan to shoot self, stab self, or crash car into a tree.   Principal Problem: Severe recurrent major depression without psychotic features [facility]) Discharge Diagnoses: Principal Problem:   Severe recurrent major depression without psychotic features (HCC) Active Problems:   Transgender   Generalized anxiety disorder   Past Psychiatric History: Previously on Lexapro which he found helpful for anxiety and depression.No previous hospital stays or suicide attempts. History of self-harm via biting  Past Medical History:  Past Medical History:  Diagnosis Date  . Anxiety   . Autism    mild case   . Depression   . Duplex kidney     Past Surgical History:  Procedure Laterality Date  . kidney wiring     Family History: History reviewed. No pertinent family history. Family Psychiatric  History: Youngest sister with narcolepsy, sister age 46 with depression and recent suicide attempt via overdose on medication. Mother with major depressive disorder, anxiety, and alcohol use disorder. Father with major depressive disorder, anxiety, and cannabis use disorder Social History:  Social History   Substance and Sexual Activity  Alcohol Use Never     Social History   Substance and Sexual Activity  Drug Use Never    Social History   Socioeconomic History  . Marital status: Single    Spouse name: Not on file  . Number of children: Not on file  . Years of education: Not on file  . Highest education level: Not on file  Occupational History  . Not on file  Tobacco Use  . Smoking status: Never  Smoker  . Smokeless tobacco: Never Used  Vaping Use  . Vaping Use: Never used  Substance and Sexual Activity  . Alcohol use: Never  . Drug use: Never  . Sexual activity: Never  Other Topics Concern  . Not on file  Social History Narrative  . Not on file   Social Determinants of Health   Financial Resource Strain:   . Difficulty of Paying Living Expenses: Not on file  Food Insecurity:   . Worried About Programme researcher, broadcasting/film/video in the Last Year: Not on file  . Ran Out of Food in the Last Year: Not on file  Transportation Needs:   . Lack of Transportation (Medical): Not on file  . Lack of Transportation (Non-Medical): Not on file  Physical Activity:   . Days of Exercise per Week: Not on file  . Minutes of Exercise per Session: Not on file  Stress:   . Feeling of Stress : Not on file  Social Connections:   . Frequency of Communication with Friends and Family: Not on file  . Frequency of Social Gatherings with Friends and Family: Not on file  . Attends Religious Services: Not on file  . Active Member of Clubs or Organizations: Not on file  . Attends Banker Meetings: Not on file  . Marital Status: Not on file    Hospital Course:  Regina Atkins was admitted to the hospital voluntarily for worsening depression with suicidal ideations. He had previously had a good response to lexapro 10 mg daily. He felt this  reduced his anxiety tremendously, and prevented suicidal thoughts from occurring. This medication was restarted, and tolerated well. He denied suicidal ideations, homicidal ideations, visual hallucinations, and auditory hallucinations while in the hospital. Father contacted, and he confirms the gun in the house has a trigger lock and is then locked in a gun safe. Father felt safe with Regina Atkins returning home, and had no other concerns. Father, patient, and treatment team felt he was safe to discharge home with close outpatient follow-up.   Physical Findings: AIMS:  , ,  ,  ,    0 CIWA:   0 COWS:   0  Musculoskeletal: Strength & Muscle Tone: within normal limits Gait & Station: normal Patient leans: N/A  Psychiatric Specialty Exam: Physical Exam Vitals and nursing note reviewed.  Constitutional:      Appearance: Normal appearance.  HENT:     Head: Normocephalic and atraumatic.     Right Ear: External ear normal.     Left Ear: External ear normal.     Nose: Nose normal.     Mouth/Throat:     Mouth: Mucous membranes are moist.     Pharynx: Oropharynx is clear.  Eyes:     Extraocular Movements: Extraocular movements intact.     Conjunctiva/sclera: Conjunctivae normal.     Pupils: Pupils are equal, round, and reactive to light.  Cardiovascular:     Rate and Rhythm: Normal rate.     Pulses: Normal pulses.  Pulmonary:     Effort: Pulmonary effort is normal.     Breath sounds: Normal breath sounds.  Abdominal:     General: Abdomen is flat.     Palpations: Abdomen is soft.  Musculoskeletal:        General: No swelling. Normal range of motion.     Cervical back: Normal range of motion. No rigidity.  Skin:    General: Skin is warm and dry.  Neurological:     General: No focal deficit present.     Mental Status: He is alert and oriented to person, place, and time.  Psychiatric:        Mood and Affect: Mood normal.        Behavior: Behavior normal.        Thought Content: Thought content normal.        Judgment: Judgment normal.     Review of Systems  Constitutional: Negative for activity change and appetite change.  HENT: Negative for rhinorrhea and sore throat.   Eyes: Negative for photophobia and visual disturbance.  Respiratory: Negative for cough and shortness of breath.   Cardiovascular: Negative for chest pain and palpitations.  Gastrointestinal: Negative for constipation, diarrhea, nausea and vomiting.  Endocrine: Negative for cold intolerance and heat intolerance.  Genitourinary: Negative for difficulty urinating and dysuria.   Musculoskeletal: Negative for back pain and myalgias.  Skin: Negative for rash and wound.  Allergic/Immunologic: Negative for environmental allergies and food allergies.  Neurological: Negative for dizziness and headaches.  Hematological: Negative for adenopathy. Does not bruise/bleed easily.  Psychiatric/Behavioral: Negative for dysphoric mood, hallucinations and suicidal ideas.    Blood pressure 107/81, pulse 78, temperature 97.9 F (36.6 C), temperature source Oral, resp. rate 17, height 5\' 5"  (1.651 m), weight 60.8 kg, SpO2 100 %.Body mass index is 22.3 kg/m.  General Appearance: Well Groomed  Eye Contact::  Good  Speech:  Normal Rate409  Volume:  Normal  Mood:  Euthymic  Affect:  Congruent  Thought Process:  Coherent  Orientation:  Full (Time, Place,  and Person)  Thought Content:  Logical  Suicidal Thoughts:  No  Homicidal Thoughts:  No  Memory:  Immediate;   Fair Recent;   Fair Remote;   Fair  Judgement:  Fair  Insight:  Fair  Psychomotor Activity:  Normal  Concentration:  Good  Recall:  Fair  Fund of Knowledge:Good  Language: Good  Akathisia:  Negative  Handed:  Right  AIMS (if indicated):     Assets:  Communication Skills Desire for Improvement Financial Resources/Insurance Housing Leisure Time Physical Health Resilience Social Support Talents/Skills Transportation Vocational/Educational  Sleep:  Number of Hours: 7.75  Cognition: WNL  ADL's:  Intact        Have you used any form of tobacco in the last 30 days? (Cigarettes, Smokeless Tobacco, Cigars, and/or Pipes): No  Has this patient used any form of tobacco in the last 30 days? (Cigarettes, Smokeless Tobacco, Cigars, and/or Pipes) No  Blood Alcohol level:  Lab Results  Component Value Date   ETH <10 01/06/2020    Metabolic Disorder Labs:  Lab Results  Component Value Date   HGBA1C 5.1 01/07/2020   MPG 100 01/07/2020   No results found for: PROLACTIN Lab Results  Component Value Date    CHOL 178 01/07/2020   TRIG 78 01/07/2020   HDL 63 01/07/2020   CHOLHDL 2.8 01/07/2020   VLDL 16 01/07/2020   LDLCALC 99 01/07/2020    See Psychiatric Specialty Exam and Suicide Risk Assessment completed by Attending Physician prior to discharge.  Discharge destination:  Home  Is patient on multiple antipsychotic therapies at discharge:  No   Has Patient had three or more failed trials of antipsychotic monotherapy by history:  No  Recommended Plan for Multiple Antipsychotic Therapies: NA  Discharge Instructions    Diet general   Complete by: As directed    Increase activity slowly   Complete by: As directed      Allergies as of 01/09/2020   No Known Allergies     Medication List    TAKE these medications     Indication  escitalopram 10 MG tablet Commonly known as: LEXAPRO Take 1 tablet (10 mg total) by mouth daily.  Indication: Generalized Anxiety Disorder, Major Depressive Disorder       Follow-up Information    [facility] Follow up on 01/14/2020.   Why: Your appointment is scheduled for 01/15/20 at 2:00pm. This will be a virtual appointment (via Zoom) with Hardie Shackleton. Contact information: [address] [place] [address] [phone]               Follow-up recommendations:  Activity:  as tolerated Diet:  regular diet  Comments:  Lexapro 10 mg, 30-day supply with one refill sent directly to [facility] in [place] per patient request.   Signed: Jesse Sans, MD 01/09/2020, 9:47 AM

## 2020-01-09 NOTE — Progress Notes (Signed)
Recreation Therapy Notes  Date: 01/09/2020  Time: 9:30 am  Location: Room 21    Behavioral response: Appropriate   Intervention Topic: Animal Assisted Therapy   Discussion/Intervention:  Animal Assisted Therapy took place today during group.  Animal Assisted Therapy is the planned inclusion of an animal in a patient's treatment plan. The patients were able to engage in therapy with an animal during group. Participants were educated on what a service dog is and the different between a support dog and a service dog. Patient were informed on the many animal needs there are and how their needs are similar. Individuals were enlightened on the process to get a service animal or support animal. Patients got the opportunity to pet the animal and were offered emotional support from the animal and staff.  Clinical Observations/Feedback:  Patient came to group and was on topic and was focused on what peers and staff had to say. Participant shared their experiences and history with animals. Individual was social with peers, staff and animal while participating in group.  Regina Atkins LRT/CTRS         Avanni Turnbaugh 01/09/2020 11:51 AM

## 2020-01-09 NOTE — Progress Notes (Signed)
Patient pleasant and cooperative. Denies any SI, HI, AVH. Medication compliant. Appropriate with staff and peers. Voiced no concerns or complaints. Reports happy to be discharging tomorrow.  Encouragement and support provided. Safety checks maintained. Medications given as prescribed. Pt receptive and remains safe on unit with q 15 min checks.

## 2020-06-17 ENCOUNTER — Encounter: Payer: Self-pay | Admitting: Emergency Medicine

## 2020-06-17 ENCOUNTER — Emergency Department
Admission: EM | Admit: 2020-06-17 | Discharge: 2020-06-17 | Disposition: A | Discharge status: Home / Self Care | Payer: Medicaid Other | Attending: Emergency Medicine | Admitting: Emergency Medicine

## 2020-06-17 ENCOUNTER — Other Ambulatory Visit: Payer: Self-pay

## 2020-06-17 DIAGNOSIS — R309 Painful micturition, unspecified: Secondary | ICD-10-CM | POA: Insufficient documentation

## 2020-06-17 DIAGNOSIS — F840 Autistic disorder: Secondary | ICD-10-CM | POA: Insufficient documentation

## 2020-06-17 DIAGNOSIS — R1033 Periumbilical pain: Secondary | ICD-10-CM

## 2020-06-17 DIAGNOSIS — N189 Chronic kidney disease, unspecified: Secondary | ICD-10-CM | POA: Diagnosis not present

## 2020-06-17 DIAGNOSIS — R109 Unspecified abdominal pain: Secondary | ICD-10-CM | POA: Diagnosis not present

## 2020-06-17 HISTORY — DX: Other specified health status: Z78.9

## 2020-06-17 LAB — PREGNANCY, URINE: Preg Test, Ur: NEGATIVE

## 2020-06-17 LAB — URINALYSIS, COMPLETE (UACMP) WITH MICROSCOPIC
Bilirubin Urine: NEGATIVE
Glucose, UA: NEGATIVE mg/dL
Hgb urine dipstick: NEGATIVE
Ketones, ur: NEGATIVE mg/dL
Leukocytes,Ua: NEGATIVE
Nitrite: NEGATIVE
Protein, ur: NEGATIVE mg/dL
Specific Gravity, Urine: 1.019 (ref 1.005–1.030)
pH: 7 (ref 5.0–8.0)

## 2020-06-17 LAB — LIPASE, BLOOD: Lipase: 33 U/L (ref 11–51)

## 2020-06-17 LAB — COMPREHENSIVE METABOLIC PANEL
ALT: 13 U/L (ref 0–44)
AST: 23 U/L (ref 15–41)
Albumin: 4.6 g/dL (ref 3.5–5.0)
Alkaline Phosphatase: 81 U/L (ref 38–126)
Anion gap: 8 (ref 5–15)
BUN: 18 mg/dL (ref 6–20)
CO2: 26 mmol/L (ref 22–32)
Calcium: 9.2 mg/dL (ref 8.9–10.3)
Chloride: 104 mmol/L (ref 98–111)
Creatinine, Ser: 0.62 mg/dL (ref 0.44–1.00)
GFR, Estimated: >60 mL/min (ref >60)
Glucose, Bld: 69 mg/dL — ABNORMAL LOW (ref 70–99)
Potassium: 4 mmol/L (ref 3.5–5.1)
Sodium: 138 mmol/L (ref 135–145)
Total Bilirubin: 0.8 mg/dL (ref 0.3–1.2)
Total Protein: 7.6 g/dL (ref 6.5–8.1)

## 2020-06-17 LAB — CBC
HCT: 41.9 % (ref 36.0–46.0)
Hemoglobin: 14.9 g/dL (ref 12.0–15.0)
MCH: 32.7 pg (ref 26.0–34.0)
MCHC: 35.6 g/dL (ref 30.0–36.0)
MCV: 92.1 fL (ref 80.0–100.0)
Platelets: 190 10*3/uL (ref 150–400)
RBC: 4.55 MIL/uL (ref 3.87–5.11)
RDW: 11.3 % — ABNORMAL LOW (ref 11.5–15.5)
WBC: 4.3 10*3/uL (ref 4.0–10.5)
nRBC: 0 % (ref 0.0–0.2)

## 2020-06-17 MED ORDER — DICYCLOMINE HCL 10 MG PO CAPS
10.0000 mg | ORAL_CAPSULE | Freq: Three times a day (TID) | ORAL | 0 refills | Status: AC | PRN
Start: 2020-06-17 — End: 2020-07-01

## 2020-06-17 MED ORDER — PANTOPRAZOLE SODIUM 40 MG PO TBEC: 40.0000 mg | DELAYED_RELEASE_TABLET | Freq: Every day | ORAL | 1 refills | Status: AC

## 2020-06-17 NOTE — ED Provider Notes (Signed)
[facility] Emergency Department Provider Note  Time seen: 1:27 PM  I have reviewed the triage vital signs and the nursing notes.   HISTORY  Chief Complaint Abdominal Pain   HPI Regina Atkins is a 20 y.o. adult with a past medical history of depression, transgender(born female, now female), presents to the emergency department  for abdominal pain.  According to the patient for the past 2 to 3 days he has been experiencing pain in the mid abdomen.  States the pain is worse when he eats food.  Patient states he also noted a burning upon urination several days ago but none since.  Patient was born female but denies being sexually active, vaginal discharge or bleeding.  No hematuria.  No fever.  Past Medical History:  Diagnosis Date  . Anxiety   . Autism    mild case   . Depression   . Duplex kidney   . Transgender     Patient Active Problem List   Diagnosis Date Noted  . Severe recurrent major depression without psychotic features (HCC) 01/07/2020  . Generalized anxiety disorder 01/07/2020  . Transgender 05/07/2019  . VUR (vesicoureteric reflux) 04/05/2017  . 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 kidney wiring      Prior to Admission medications   Medication Sig Start Date End Date Taking? Authorizing Provider  escitalopram (LEXAPRO) 10 MG tablet Take 1 tablet (10 mg total) by mouth daily. 01/09/20   Clapacs, Jackquline Denmark, MD    No Known Allergies  History reviewed. No pertinent family history.  Social History Social History   Tobacco Use  . Smoking status: Never Smoker  . Smokeless tobacco: Never Used  Vaping Use  . Vaping Use: Every day  Substance Use Topics  . Alcohol use: Yes    Comment: occasional  . Drug use: Not Currently    Types: Marijuana    Review of Systems Constitutional:  Negative for fever. Cardiovascular: Negative for chest pain. Respiratory: Negative for shortness of breath. Gastrointestinal: Mid abdominal discomfort mild burning, somewhat worse with eating. Musculoskeletal: Negative for musculoskeletal complaints Neurological: Negative for headache All other ROS negative  ____________________________________________   PHYSICAL EXAM:  VITAL SIGNS: ED Triage Vitals [06/17/20 1238]  Enc Vitals Group     BP 112/75     Pulse Rate 75     Resp 17     Temp 97.8 F (36.6 C)     Temp Source Oral     SpO2 100 %     Weight 130 lb (59 kg)     Height 5\' 5"  (1.651 m)     Head Circumference      Peak Flow      Pain Score 6     Pain Loc      Pain Edu?      Excl. in GC?     Constitutional: Alert and oriented. Well appearing and in no distress. Eyes: Normal exam ENT      Head: Normocephalic and atraumatic.      Mouth/Throat: Mucous membranes are moist. Cardiovascular: Normal rate, regular rhythm. Respiratory: Normal respiratory effort without tachypnea nor retractions. Breath sounds are clear Gastrointestinal: Soft, very mild periumbilical tenderness without rebound guarding or distention. Musculoskeletal: Nontender with normal range of motion in all extremities Neurologic:  Normal speech and language. No gross focal neurologic deficits  Skin:  Skin is warm, dry and intact.  Psychiatric: Mood and affect are normal.   ____________________________________________   INITIAL IMPRESSION / ASSESSMENT AND PLAN / ED COURSE  Pertinent labs & imaging results that were available during my care of the patient were reviewed by me and considered in my medical decision making (see chart for details).   Patient presents emergency department for abdominal pain over the past 2 to 3 days.  Overall the patient appears well, reassuring physical exam, reassuring vitals, largely negative review of systems.  Overall the patient appears well, given that the pain is  slightly worse with eating differential would also include gastritis or peptic ulcer disease.  We will place the patient on Bentyl and the PPI.  Patient states he does suffer from gastric reflux at night discussed using Maalox each night for the next 7 nights.  Discussed return precautions.  Patient agreeable to plan of care.  We will refer to GI medicine.  Regina Atkins was evaluated in Emergency Department on 06/17/2020 for the symptoms described in the history of present illness. He was evaluated in the context of the global COVID-19 pandemic, which necessitated consideration that the patient might be at risk for infection with the SARS-CoV-2 virus that causes COVID-19. Institutional protocols and algorithms that pertain to the evaluation of patients at risk for COVID-19 are in a state of rapid change based on information released by regulatory bodies including the CDC and federal and state organizations. These policies and algorithms were followed during the patient's care in the ED.  ____________________________________________   FINAL CLINICAL IMPRESSION(S) / ED DIAGNOSES  Abdominal pain   Minna Antis, MD 06/17/20 1333

## 2020-06-17 NOTE — ED Notes (Signed)
Lab called to run pregnancy test

## 2020-06-17 NOTE — ED Triage Notes (Signed)
Pt comes into the ED via POV c/o abdominal pain with a burning sensation.  PT has been taking tums and omeprazole with no relief.  Pt states he has also had increased BM's.  Pt in NAD at this time and is ambulatory to triage.

## 2020-06-21 ENCOUNTER — Encounter: Payer: Self-pay | Admitting: Emergency Medicine

## 2020-06-21 ENCOUNTER — Emergency Department
Admission: EM | Admit: 2020-06-21 | Discharge: 2020-06-21 | Disposition: A | Discharge status: Home / Self Care | Payer: Medicaid Other | Attending: Emergency Medicine | Admitting: Emergency Medicine

## 2020-06-21 DIAGNOSIS — R109 Unspecified abdominal pain: Secondary | ICD-10-CM | POA: Diagnosis not present

## 2020-06-21 DIAGNOSIS — R519 Headache, unspecified: Secondary | ICD-10-CM | POA: Insufficient documentation

## 2020-06-21 DIAGNOSIS — Z5321 Procedure and treatment not carried out due to patient leaving prior to being seen by health care provider: Secondary | ICD-10-CM | POA: Diagnosis not present

## 2020-06-21 DIAGNOSIS — R400 Somnolence: Secondary | ICD-10-CM | POA: Diagnosis not present

## 2020-06-21 NOTE — ED Notes (Signed)
Paduchowski, MD consulted for orders. No orders needed at this time.

## 2020-06-21 NOTE — ED Triage Notes (Signed)
Pt reports she was seen and diagnosed with GERD and was to take Bentyl TID as needed and Protonix once a day but switched and took Protonix TID and Bentyl once a day since 3/30. Pt to ED tonight due to increased abdominal pain and drowsiness with intermittent HA.

## 2020-06-22 ENCOUNTER — Emergency Department
Admission: EM | Admit: 2020-06-22 | Discharge: 2020-06-22 | Disposition: A | Discharge status: Home / Self Care | Payer: Medicaid Other | Attending: Emergency Medicine | Admitting: Emergency Medicine

## 2020-06-22 ENCOUNTER — Emergency Department: Payer: Medicaid Other

## 2020-06-22 ENCOUNTER — Other Ambulatory Visit: Payer: Self-pay

## 2020-06-22 DIAGNOSIS — R109 Unspecified abdominal pain: Secondary | ICD-10-CM | POA: Diagnosis present

## 2020-06-22 DIAGNOSIS — F840 Autistic disorder: Secondary | ICD-10-CM | POA: Diagnosis not present

## 2020-06-22 DIAGNOSIS — R1084 Generalized abdominal pain: Secondary | ICD-10-CM | POA: Diagnosis not present

## 2020-06-22 DIAGNOSIS — N189 Chronic kidney disease, unspecified: Secondary | ICD-10-CM | POA: Insufficient documentation

## 2020-06-22 DIAGNOSIS — R110 Nausea: Secondary | ICD-10-CM | POA: Diagnosis not present

## 2020-06-22 LAB — COMPREHENSIVE METABOLIC PANEL
ALT: 29 U/L (ref 0–44)
AST: 41 U/L (ref 15–41)
Albumin: 4.8 g/dL (ref 3.5–5.0)
Alkaline Phosphatase: 73 U/L (ref 38–126)
Anion gap: 9 (ref 5–15)
BUN: 17 mg/dL (ref 6–20)
CO2: 27 mmol/L (ref 22–32)
Calcium: 9.4 mg/dL (ref 8.9–10.3)
Chloride: 104 mmol/L (ref 98–111)
Creatinine, Ser: 0.74 mg/dL (ref 0.44–1.00)
GFR, Estimated: >60 mL/min (ref >60)
Glucose, Bld: 109 mg/dL — ABNORMAL HIGH (ref 70–99)
Potassium: 3.6 mmol/L (ref 3.5–5.1)
Sodium: 140 mmol/L (ref 135–145)
Total Bilirubin: 1 mg/dL (ref 0.3–1.2)
Total Protein: 7.9 g/dL (ref 6.5–8.1)

## 2020-06-22 LAB — LIPASE, BLOOD: Lipase: 31 U/L (ref 11–51)

## 2020-06-22 LAB — URINALYSIS, COMPLETE (UACMP) WITH MICROSCOPIC
Bilirubin Urine: NEGATIVE
Glucose, UA: NEGATIVE mg/dL
Hgb urine dipstick: NEGATIVE
Ketones, ur: NEGATIVE mg/dL
Nitrite: NEGATIVE
Protein, ur: NEGATIVE mg/dL
Specific Gravity, Urine: 1.021 (ref 1.005–1.030)
pH: 7 (ref 5.0–8.0)

## 2020-06-22 LAB — CBC
HCT: 42.4 % (ref 36.0–46.0)
Hemoglobin: 15.3 g/dL — ABNORMAL HIGH (ref 12.0–15.0)
MCH: 32.7 pg (ref 26.0–34.0)
MCHC: 36.1 g/dL — ABNORMAL HIGH (ref 30.0–36.0)
MCV: 90.6 fL (ref 80.0–100.0)
Platelets: 205 10*3/uL (ref 150–400)
RBC: 4.68 MIL/uL (ref 3.87–5.11)
RDW: 11.2 % — ABNORMAL LOW (ref 11.5–15.5)
WBC: 5.1 10*3/uL (ref 4.0–10.5)
nRBC: 0 % (ref 0.0–0.2)

## 2020-06-22 LAB — POC URINE PREG, ED: Preg Test, Ur: NEGATIVE

## 2020-06-22 MED ORDER — ALUM & MAG HYDROXIDE-SIMETH 200-200-20 MG/5ML PO SUSP
30.0000 mL | Freq: Once | ORAL | Status: AC
  Administered 2020-06-22: 30 mL via ORAL
  Filled 2020-06-22: qty 30

## 2020-06-22 MED ORDER — MORPHINE SULFATE (PF) 4 MG/ML IV SOLN
4.0000 mg | Freq: Once | INTRAVENOUS | Status: AC
  Administered 2020-06-22: 4 mg via INTRAVENOUS
  Filled 2020-06-22: qty 1

## 2020-06-22 MED ORDER — LIDOCAINE VISCOUS HCL 2 % MT SOLN
15.0000 mL | Freq: Once | OROMUCOSAL | Status: AC
  Administered 2020-06-22: 15 mL via ORAL
  Filled 2020-06-22: qty 15

## 2020-06-22 MED ORDER — IOHEXOL 300 MG/ML  SOLN
100.0000 mL | Freq: Once | INTRAMUSCULAR | Status: AC | PRN
  Administered 2020-06-22: 100 mL via INTRAVENOUS
  Filled 2020-06-22: qty 100

## 2020-06-22 NOTE — ED Triage Notes (Signed)
Pt comes with c/o abdominal pain following mixing up medications. Pt was here last night and checked in but left before being seen.

## 2020-06-22 NOTE — ED Provider Notes (Signed)
[facility] Emergency Department Provider Note ____________________________________________   Event Date/Time   First MD Initiated Contact with Patient 06/22/20 1411     (approximate)  I have reviewed the triage vital signs and the nursing notes.  HISTORY  Chief Complaint Abdominal Pain   HPI Regina Atkins is a 20 y.o. adultwho presents to the ED for evaluation of abd pain.   Chart review indicates transgender patient, female to female, hx depression and anxiety. Couple ED visits in the past week for similar abd pain. Evaluated on 3/30 in the ED. Benign blood and urine testing, discharged with bentyl and ppi.  Patient self-reports that he has completed a regimen of testosterone hormonal treatment, but has had no surgeries performed during his transition from female to female.  Patient presents to the ED for evaluation of continued abdominal pain over the past 1-2 weeks.  Patient reports these are similar symptoms as when he was evaluated on 3/30.  Patient initially reports concern that he mixed up his PPI and Bentyl, accidentally taken the PPI 3 times a day and the Bentyl twice a day.  Reports significant concerned that he may have harmed himself and doing this over the past 1 week.  Reports continued pain at multiple sites in his abdomen, epigastrium, RUQ and RLQ.  Reports the pain is primarily postprandial, worsening 1 hour after eating.  He reports occasional associated nausea, but denies any emesis, stool changes, dysuria or hematuria.  Currently reporting 6/10 intensity pain.    Past Medical History:  Diagnosis Date  . Anxiety   . Autism    mild case   . Depression   . Duplex kidney   . Transgender     Patient Active Problem List   Diagnosis Date Noted  . Severe recurrent major depression without psychotic features (HCC) 01/07/2020  . Generalized anxiety disorder 01/07/2020  . Transgender 05/07/2019  . VUR (vesicoureteric reflux) 04/05/2017  .  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 kidney wiring      Prior to Admission medications   Medication Sig Start Date End Date Taking? Authorizing Provider  dicyclomine (BENTYL) 10 MG capsule Take 1 capsule (10 mg total) by mouth 3 (three) times daily as needed for up to 14 days for spasms. 06/17/20 07/01/20  Minna Antis, MD  escitalopram (LEXAPRO) 10 MG tablet Take 1 tablet (10 mg total) by mouth daily. 01/09/20   Clapacs, Jackquline Denmark, MD  pantoprazole (PROTONIX) 40 MG tablet Take 1 tablet (40 mg total) by mouth daily. 06/17/20 06/17/21  Minna Antis, MD    Allergies Patient has no known allergies.  No family history on file.  Social History Social History   Tobacco Use  . Smoking status: Never Smoker  . Smokeless tobacco: Never Used  Vaping Use  . Vaping Use: Every day  Substance Use Topics  . Alcohol use: Yes    Comment: occasional  . Drug use: Not Currently    Types: Marijuana    Review of Systems  Constitutional: No fever/chills Eyes: No visual changes. ENT: No sore throat. Cardiovascular: Denies chest pain. Respiratory: Denies shortness of breath. Gastrointestinal: Positive for abdominal pain and nausea no vomiting.  No diarrhea.  No constipation. Genitourinary: Negative for dysuria. Musculoskeletal: Negative for back pain. Skin: Negative for rash. Neurological: Negative for headaches, focal weakness or numbness.  ____________________________________________  PHYSICAL EXAM:  VITAL SIGNS: Vitals:   06/22/20 1326 06/22/20 1642  BP: (!) 122/100 120/88  Pulse: (!) 104 88  Resp: 17 16  Temp: 98.2 F (36.8 C)   SpO2: 100% 100%     Constitutional: Alert and oriented. Well appearing and in no acute distress. Eyes: Conjunctivae are normal. PERRL. EOMI. Head: Atraumatic. Nose: No  congestion/rhinnorhea. Mouth/Throat: Mucous membranes are moist.  Oropharynx non-erythematous. Neck: No stridor. No cervical spine tenderness to palpation. Cardiovascular: Normal rate, regular rhythm. Grossly normal heart sounds.  Good peripheral circulation. Respiratory: Normal respiratory effort.  No retractions. Lungs CTAB. Gastrointestinal: Soft , nondistended. No CVA tenderness. Diffuse tenderness without localizing or peritoneal features. Musculoskeletal: No lower extremity tenderness nor edema.  No joint effusions. No signs of acute trauma. Neurologic:  Normal speech and language. No gross focal neurologic deficits are appreciated. No gait instability noted. Skin:  Skin is warm, dry and intact. No rash noted. Psychiatric: Mood and affect are normal. Speech and behavior are normal.  ____________________________________________   LABS (all labs ordered are listed, but only abnormal results are displayed)  Labs Reviewed  COMPREHENSIVE METABOLIC PANEL - Abnormal; Notable for the following components:      Result Value   Glucose, Bld 109 (*)    All other components within normal limits  CBC - Abnormal; Notable for the following components:   Hemoglobin 15.3 (*)    MCHC 36.1 (*)    RDW 11.2 (*)    All other components within normal limits  URINALYSIS, COMPLETE (UACMP) WITH MICROSCOPIC - Abnormal; Notable for the following components:   Color, Urine YELLOW (*)    APPearance CLOUDY (*)    Leukocytes,Ua MODERATE (*)    Bacteria, UA RARE (*)    All other components within normal limits  LIPASE, BLOOD  POC URINE PREG, ED   ____________________________________________  RADIOLOGY  ED MD interpretation: CT abdomen/pelvis reviewed by me without evidence of acute intra-abdominal pathology.  Official radiology report(s): CT ABDOMEN PELVIS W CONTRAST  Result Date: 06/22/2020 CLINICAL DATA:  20 year old female with abdominal pain. EXAM: CT ABDOMEN AND PELVIS WITH CONTRAST TECHNIQUE:  Multidetector CT imaging of the abdomen and pelvis was performed using the standard protocol following bolus administration of intravenous contrast. CONTRAST:  OMNIPAQUE IOHEXOL 300 MG/ML  SOLN COMPARISON:  CT abdomen pelvis dated 02/22/2019. FINDINGS: Lower chest: The visualized lung bases are clear. No intra-abdominal free air or free fluid. Hepatobiliary: The liver is unremarkable. No intrahepatic biliary dilatation. Probable gallbladder sludge. No calcified gallstone or pericholecystic fluid. Pancreas: Unremarkable. No pancreatic ductal dilatation or surrounding inflammatory changes. Spleen: Normal in size without focal abnormality. Adrenals/Urinary Tract: The adrenal glands unremarkable. There is cortical irregularity and lobulation of the kidneys bilaterally. No hydronephrosis. The visualized ureters and urinary bladder appear unremarkable. Stomach/Bowel: There is no bowel obstruction or active inflammation. The appendix is normal. Vascular/Lymphatic: The abdominal aorta and IVC are unremarkable. No portal venous gas. There is no adenopathy. Reproductive: The uterus is retroverted. There is arcuate or bicornuate uterine morphology. No adnexal masses. Other: None Musculoskeletal: No acute or significant osseous findings. IMPRESSION: No acute intra-abdominal or pelvic pathology. No bowel obstruction. Normal appendix on. Electronically Signed   By: Elgie Collard M.D.   On: 06/22/2020 16:19    ____________________________________________   PROCEDURES and INTERVENTIONS  Procedure(s) performed (including Critical Care):  .1-3 Lead EKG Interpretation Performed by: Delton Prairie, MD Authorized by: Delton Prairie, MD     Interpretation: normal     ECG rate:  80   ECG rate assessment: normal     Rhythm: sinus rhythm     Ectopy: none     Conduction: normal      Medications  alum & mag hydroxide-simeth (MAALOX/MYLANTA) 200-200-20 MG/5ML suspension 30 mL (30 mLs Oral Given 06/22/20 1450)    And   lidocaine (XYLOCAINE) 2 % viscous mouth solution 15 mL (15 mLs Oral Given 06/22/20 1450)  morphine 4 MG/ML injection 4 mg (4 mg Intravenous Given 06/22/20 1553)  iohexol (OMNIPAQUE) 300 MG/ML solution 100 mL (100 mLs Intravenous Contrast Given 06/22/20 1542)    ____________________________________________   MDM / ED COURSE   Transgender female-female 20 year old presents to the ED with acute on subacute abdominal pain without evidence of acute pathology and amenable to outpatient management.  Normal vitals on room air.  Exam with diffuse tenderness throughout the abdomen without localizing or peritoneal features.  Patient looks well without distress, and I see no evidence of acute pathology otherwise on examination.  Blood work is reassuring without evidence of acute derangements.  No leukocytosis, electrolyte derangements or signs of renal dysfunction.  Urinalysis without infectious features, considering his lack of symptoms with urination.  Due to second ED visit and progressive pain in the past week, CT imaging of abdomen/pelvis obtained without evidence of acute intra-abdominal pathology.  No evidence of acute appendicitis, biliary pathology such as cholecystitis or cholelithiasis. Patient has resolution of symptoms after GI cocktail and morphine.  Subsequently tolerating p.o. intake.  We discussed adherence to outpatient medications and following up with PCP as an outpatient.  Return precautions for the ED were discussed and patient stable for outpatient management.   Clinical Course as of 06/22/20 1711  Mon Jun 22, 2020  1522 Reassessed.  Patient reports minimal improvement after GI cocktail.  We discussed CT imaging.  He is agreeable. [DS]  1640 Reassessed.  Patient reports improved pain.  He has already tolerated p.o. intake.  We discussed adherence to PPI and Bentyl.  We discussed following up as an outpatient with PCP.  Return precautions for the ED were discussed. [DS]    Clinical Course User  Index [DS] Delton Prairie, MD    ____________________________________________   FINAL CLINICAL IMPRESSION(S) / ED DIAGNOSES  Final diagnoses:  Generalized abdominal pain     ED Discharge Orders    None       Sevrin Sally Katrinka Blazing   Note:  This document was prepared using Dragon voice recognition software and may include unintentional dictation errors.   Delton Prairie, MD 06/22/20 [id number]

## 2020-06-22 NOTE — ED Notes (Signed)
See triage note  Presents with epigastric pain and right lower back pain for the past couple of days  No fever but has had positive nausea  States pain is worse when eating

## 2020-06-22 NOTE — Discharge Instructions (Addendum)
As we discussed, please continue to take the Bentyl and Protonix medications as prescribed.  You can call [facility], and I have attached their phone number, to establish with a primary care doctor here locally.  If you develop any worsening symptoms despite these medications, fevers with your symptoms or recurrent throwing up, please return to the ED.

## 2020-09-22 ENCOUNTER — Other Ambulatory Visit: Payer: Self-pay | Admitting: Psychiatry

## 2020-09-22 DIAGNOSIS — F419 Anxiety disorder, unspecified: Secondary | ICD-10-CM

## 2020-09-22 DIAGNOSIS — F321 Major depressive disorder, single episode, moderate: Secondary | ICD-10-CM

## 2021-07-27 ENCOUNTER — Emergency Department: Payer: Medicaid Other

## 2021-07-27 ENCOUNTER — Other Ambulatory Visit: Payer: Self-pay

## 2021-07-27 ENCOUNTER — Emergency Department
Admission: EM | Admit: 2021-07-27 | Discharge: 2021-07-27 | Disposition: A | Discharge status: Home / Self Care | Payer: Medicaid Other | Attending: Student in an Organized Health Care Education/Training Program | Admitting: Student in an Organized Health Care Education/Training Program

## 2021-07-27 DIAGNOSIS — M542 Cervicalgia: Secondary | ICD-10-CM | POA: Diagnosis not present

## 2021-07-27 DIAGNOSIS — S0990XA Unspecified injury of head, initial encounter: Secondary | ICD-10-CM | POA: Diagnosis not present

## 2021-07-27 DIAGNOSIS — Y92410 Unspecified street and highway as the place of occurrence of the external cause: Secondary | ICD-10-CM | POA: Insufficient documentation

## 2021-07-27 DIAGNOSIS — S8001XA Contusion of right knee, initial encounter: Secondary | ICD-10-CM | POA: Insufficient documentation

## 2021-07-27 DIAGNOSIS — M79604 Pain in right leg: Secondary | ICD-10-CM | POA: Diagnosis present

## 2021-07-27 LAB — CBC
HCT: 42.2 % (ref 36.0–46.0)
Hemoglobin: 14.5 g/dL (ref 12.0–15.0)
MCH: 31.3 pg (ref 26.0–34.0)
MCHC: 34.4 g/dL (ref 30.0–36.0)
MCV: 91.1 fL (ref 80.0–100.0)
Platelets: 222 10*3/uL (ref 150–400)
RBC: 4.63 MIL/uL (ref 3.87–5.11)
RDW: 11.8 % (ref 11.5–15.5)
WBC: 7.4 10*3/uL (ref 4.0–10.5)
nRBC: 0 % (ref 0.0–0.2)

## 2021-07-27 LAB — COMPREHENSIVE METABOLIC PANEL
ALT: 27 U/L (ref 0–44)
AST: 37 U/L (ref 15–41)
Albumin: 4.5 g/dL (ref 3.5–5.0)
Alkaline Phosphatase: 54 U/L (ref 38–126)
Anion gap: 7 (ref 5–15)
BUN: 11 mg/dL (ref 6–20)
CO2: 25 mmol/L (ref 22–32)
Calcium: 9.2 mg/dL (ref 8.9–10.3)
Chloride: 108 mmol/L (ref 98–111)
Creatinine, Ser: 0.88 mg/dL (ref 0.44–1.00)
GFR, Estimated: >60 mL/min (ref >60)
Glucose, Bld: 108 mg/dL — ABNORMAL HIGH (ref 70–99)
Potassium: 3.5 mmol/L (ref 3.5–5.1)
Sodium: 140 mmol/L (ref 135–145)
Total Bilirubin: 0.5 mg/dL (ref 0.3–1.2)
Total Protein: 7.7 g/dL (ref 6.5–8.1)

## 2021-07-27 LAB — LIPASE, BLOOD: Lipase: 33 U/L (ref 11–51)

## 2021-07-27 MED ORDER — ONDANSETRON HCL 4 MG/2ML IJ SOLN
4.0000 mg | Freq: Once | INTRAMUSCULAR | Status: AC
  Administered 2021-07-27: 4 mg via INTRAVENOUS
  Filled 2021-07-27: qty 2

## 2021-07-27 MED ORDER — MORPHINE SULFATE (PF) 4 MG/ML IV SOLN
4.0000 mg | INTRAVENOUS | Status: DC | PRN
  Administered 2021-07-27: 4 mg via INTRAVENOUS
  Filled 2021-07-27: qty 1

## 2021-07-27 MED ORDER — CYCLOBENZAPRINE HCL 10 MG PO TABS: 10.0000 mg | ORAL_TABLET | Freq: Three times a day (TID) | ORAL | 0 refills | Status: AC | PRN

## 2021-07-27 MED ORDER — SODIUM CHLORIDE 0.9 % IV BOLUS
1000.0000 mL | Freq: Once | INTRAVENOUS | Status: AC
  Administered 2021-07-27: 1000 mL via INTRAVENOUS

## 2021-07-27 MED ORDER — OXYCODONE-ACETAMINOPHEN 5-325 MG PO TABS: 1.0000 | ORAL_TABLET | ORAL | 0 refills | Status: AC | PRN
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
## 2022-05-23 ENCOUNTER — Ambulatory Visit: Admit: 2022-05-23 | Discharge status: Absent without leave | Payer: Medicaid Other

## 2022-07-25 ENCOUNTER — Encounter (HOSPITAL_COMMUNITY): Payer: Self-pay

## 2023-01-02 ENCOUNTER — Ambulatory Visit (HOSPITAL_COMMUNITY)
Admission: EM | Admit: 2023-01-02 | Discharge: 2023-01-02 | Disposition: A | Discharge status: Home / Self Care | Payer: MEDICAID | Attending: Psychiatry | Admitting: Psychiatry

## 2023-01-02 DIAGNOSIS — Z91148 Patient's other noncompliance with medication regimen for other reason: Secondary | ICD-10-CM | POA: Insufficient documentation

## 2023-01-02 DIAGNOSIS — F411 Generalized anxiety disorder: Secondary | ICD-10-CM | POA: Insufficient documentation

## 2023-01-02 DIAGNOSIS — F410 Panic disorder [episodic paroxysmal anxiety] without agoraphobia: Secondary | ICD-10-CM | POA: Insufficient documentation

## 2023-01-02 DIAGNOSIS — F4310 Post-traumatic stress disorder, unspecified: Secondary | ICD-10-CM | POA: Insufficient documentation

## 2023-01-02 NOTE — Discharge Instructions (Signed)
F/u with walk-in clinic F/u with outpatient resources provided
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
## 2023-01-02 NOTE — Progress Notes (Signed)
   01/02/23 1755  [facility] Triage Screening (Walk-ins at [facility] only)  How Did You Hear About [place]? Family/Friend  What Is the Reason for Your Visit/Call Today? Regina Atkins is a 22 year old transgender female who presents to [facility] voluntarily accompanied by his friend due to increased panic attacks over the past 1 month. Pt states he had to leave work early today because he was crying uncontrollably, shaking. Pt reports passive SI but denies plan or intent. Pt states "I don't want to die". Pt states he is supposed to be taking Zoloft  but he had to stop taking it because it was affecting his GERD. Pt states he is also prescribed Hydroxyzine by his PCP at [facility]. Pt is not established with outpatient therapy. Pt reports occasional marijuana use, last use was 2 days ago. Pt reports being diagnosed with mild autism,depression and GAD. Pt states he lives with his bestfriend and her family.  Pt denies HI and AVH.  How Long Has This Been Causing You Problems? 1 wk - 1 month  Have You Recently Had Any Thoughts About Hurting Yourself? Yes  How long ago did you have thoughts about hurting yourself? passive SI  Are You Planning to Commit Suicide/Harm Yourself At This time? No  Have you Recently Had Thoughts About Hurting Someone [name]? No  Are You Planning To Harm Someone At This Time? No  Are you currently experiencing any auditory, visual or other hallucinations? No  Have You Used Any Alcohol or Drugs in the Past 24 Hours? No  Do you have any current medical co-morbidities that require immediate attention? No  Clinician description of patient physical appearance/behavior: nervous, swinging legs in chair, casually dressed, cooperative  What Do You Feel Would Help You the Most Today? Treatment for Depression or other mood problem  If access to [facility] was not available, would you have sought care in the Emergency Department? No  Determination of Need Routine (7 days)  Options For Referral  Outpatient Therapy;Medication Management
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
